# Patient Record
Sex: Female | Born: 1985 | Race: White | Hispanic: No | Marital: Married | Smoking: Never smoker
Health system: Southern US, Community
[De-identification: ages and names within clinical notes are randomized; demographics above are authoritative.]

## PROBLEM LIST (undated history)

## (undated) HISTORY — PX: KNEE SURGERY: SHX244

---
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
## 2009-08-25 ENCOUNTER — Encounter: Admission: RE | Admit: 2009-08-25 | Discharge: 2009-08-25 | Payer: Self-pay | Admitting: Family Medicine

## 2011-04-09 ENCOUNTER — Other Ambulatory Visit: Payer: Self-pay | Admitting: Lactation Services

## 2011-04-09 ENCOUNTER — Ambulatory Visit
Admission: RE | Admit: 2011-04-09 | Discharge: 2011-04-09 | Disposition: A | Discharge status: Home / Self Care | Payer: 59 | Source: Ambulatory Visit | Attending: Family Medicine | Admitting: Family Medicine

## 2011-04-09 ENCOUNTER — Other Ambulatory Visit: Payer: Self-pay | Admitting: Family Medicine

## 2011-04-09 DIAGNOSIS — S60229A Contusion of unspecified hand, initial encounter: Secondary | ICD-10-CM

## 2017-11-23 ENCOUNTER — Other Ambulatory Visit: Payer: Self-pay

## 2017-11-23 ENCOUNTER — Emergency Department
Admission: EM | Admit: 2017-11-23 | Discharge: 2017-11-24 | Disposition: A | Discharge status: Home / Self Care | Payer: 59 | Attending: Emergency Medicine | Admitting: Emergency Medicine

## 2017-11-23 ENCOUNTER — Encounter: Payer: Self-pay | Admitting: Emergency Medicine

## 2017-11-23 ENCOUNTER — Emergency Department: Payer: 59

## 2017-11-23 DIAGNOSIS — R109 Unspecified abdominal pain: Secondary | ICD-10-CM | POA: Diagnosis present

## 2017-11-23 DIAGNOSIS — R1012 Left upper quadrant pain: Secondary | ICD-10-CM | POA: Insufficient documentation

## 2017-11-23 LAB — URINALYSIS, COMPLETE (UACMP) WITH MICROSCOPIC
BILIRUBIN URINE: NEGATIVE
Bacteria, UA: NONE SEEN
GLUCOSE, UA: NEGATIVE mg/dL
HGB URINE DIPSTICK: NEGATIVE
Ketones, ur: NEGATIVE mg/dL
Leukocytes, UA: NEGATIVE
NITRITE: NEGATIVE
PH: 6 (ref 5.0–8.0)
Protein, ur: NEGATIVE mg/dL
SPECIFIC GRAVITY, URINE: 1.013 (ref 1.005–1.030)

## 2017-11-23 LAB — COMPREHENSIVE METABOLIC PANEL
ALBUMIN: 3.9 g/dL (ref 3.5–5.0)
ALK PHOS: 45 U/L (ref 38–126)
ALT: 13 U/L — AB (ref 14–54)
AST: 18 U/L (ref 15–41)
Anion gap: 7 (ref 5–15)
BILIRUBIN TOTAL: 0.5 mg/dL (ref 0.3–1.2)
BUN: 11 mg/dL (ref 6–20)
CALCIUM: 8.6 mg/dL — AB (ref 8.9–10.3)
CO2: 25 mmol/L (ref 22–32)
CREATININE: 1.08 mg/dL — AB (ref 0.44–1.00)
Chloride: 106 mmol/L (ref 101–111)
GFR calc Af Amer: >60 mL/min (ref >60)
GFR calc non Af Amer: >60 mL/min (ref >60)
GLUCOSE: 100 mg/dL — AB (ref 65–99)
Potassium: 3.8 mmol/L (ref 3.5–5.1)
SODIUM: 138 mmol/L (ref 135–145)
TOTAL PROTEIN: 7 g/dL (ref 6.5–8.1)

## 2017-11-23 LAB — CBC
HCT: 43.7 % (ref 35.0–47.0)
Hemoglobin: 14.8 g/dL (ref 12.0–16.0)
MCH: 29.6 pg (ref 26.0–34.0)
MCHC: 33.8 g/dL (ref 32.0–36.0)
MCV: 87.8 fL (ref 80.0–100.0)
PLATELETS: 252 10*3/uL (ref 150–440)
RBC: 4.98 MIL/uL (ref 3.80–5.20)
RDW: 13.1 % (ref 11.5–14.5)
WBC: 11 10*3/uL (ref 3.6–11.0)

## 2017-11-23 LAB — LIPASE, BLOOD: Lipase: 31 U/L (ref 11–51)

## 2017-11-23 MED ORDER — KETOROLAC TROMETHAMINE 30 MG/ML IJ SOLN
15.0000 mg | Freq: Once | INTRAMUSCULAR | Status: AC
  Administered 2017-11-23: 15 mg via INTRAVENOUS
  Filled 2017-11-23: qty 1

## 2017-11-23 MED ORDER — SODIUM CHLORIDE 0.9 % IV BOLUS
1000.0000 mL | Freq: Once | INTRAVENOUS | Status: AC
  Administered 2017-11-23: 1000 mL via INTRAVENOUS

## 2017-11-23 MED ORDER — FAMOTIDINE 20 MG PO TABS: 20.0000 mg | ORAL_TABLET | Freq: Two times a day (BID) | ORAL | 1 refills | Status: AC

## 2017-11-23 MED ORDER — IOPAMIDOL (ISOVUE-300) INJECTION 61%
100.0000 mL | Freq: Once | INTRAVENOUS | Status: AC | PRN
  Administered 2017-11-23: 100 mL via INTRAVENOUS

## 2017-11-23 MED ORDER — FAMOTIDINE 20 MG PO TABS
20.0000 mg | ORAL_TABLET | Freq: Once | ORAL | Status: AC
  Administered 2017-11-24: 20 mg via ORAL
  Filled 2017-11-23: qty 1

## 2017-11-23 NOTE — ED Triage Notes (Addendum)
Upper L abd pain x 4 days. [facility].

## 2017-11-23 NOTE — ED Provider Notes (Signed)
[facility] Emergency Department Provider Note  ____________________________________________  Time seen: Approximately 10:44 PM  I have reviewed the triage vital signs and the nursing notes.   HISTORY  Chief Complaint Abdominal Pain   HPI Robin Watson is a 32 y.o. female with no significant past medical history who presents for evaluation of abdominal pain.  Patient reports that she started to feel sick 4 days ago.  She initially had couple episodes of watery diarrhea.  The following day the diarrhea stopped and she had 2 or 3 episodes of nonbloody nonbilious emesis.  For the last 4 days she has had intermittent left-sided abdominal pain.  She went to walk-in and had an x-ray and basic labs which were within normal limits a few days ago.  She was given Zofran.  She has been taking it at home.  She reports resolution of the vomiting and diarrhea.  She was told that if the abdominal pain got worse that she needed to be evaluated in the emergency department.  She reports that the abdominal pain got worse last night.  The pain is constant sharp usually mild with a severe intermittent component.  At this time patient is complaining of 3 out of 10 pain.  She denies dysuria, hematuria, fever, vaginal bleeding, vaginal discharge located on the L quadrants.  No prior abdominal surgeries. No NSAIDs  PMH Reviewed - None  Meds Zofran  Allergies Patient has no known allergies.  FH Non contributory  Social History Smoking: none Alcohol: none Drugs: none   Review of Systems  Constitutional: Negative for fever. Eyes: Negative for visual changes. ENT: Negative for sore throat. Neck: No neck pain  Cardiovascular: Negative for chest pain. Respiratory: Negative for shortness of breath. Gastrointestinal: + L sided abdominal pain, vomiting and diarrhea. Genitourinary: Negative for dysuria. Musculoskeletal: Negative for back pain. Skin: Negative for  rash. Neurological: Negative for headaches, weakness or numbness. Psych: No SI or HI  ____________________________________________   PHYSICAL EXAM:  VITAL SIGNS: ED Triage Vitals [11/23/17 1737]  Enc Vitals Group     BP 122/76     Pulse Rate 67     Resp 20     Temp 99 F (37.2 C)     Temp Source Oral     SpO2 99 %     Weight 210 lb (95.3 kg)     Height 5\' 8"  (1.727 m)     Head Circumference      Peak Flow      Pain Score 3     Pain Loc      Pain Edu?      Excl. in GC?     Constitutional: Alert and oriented. Well appearing and in no apparent distress. HEENT:      Head: Normocephalic and atraumatic.         Eyes: Conjunctivae are normal. Sclera is non-icteric.       Mouth/Throat: Mucous membranes are moist.       Neck: Supple with no signs of meningismus. Cardiovascular: Regular rate and rhythm. No murmurs, gallops, or rubs. 2+ symmetrical distal pulses are present in all extremities. No JVD. Respiratory: Normal respiratory effort. Lungs are clear to auscultation bilaterally. No wheezes, crackles, or rhonchi.  Gastrointestinal: Soft, tenderness to palpation on the left upper quadrant, and non distended with positive bowel sounds. No rebound or guarding. Genitourinary: No CVA tenderness. Musculoskeletal: Nontender with normal range of motion in all extremities. No edema, cyanosis, or erythema of extremities. Neurologic: Normal speech  and language. Face is symmetric. Moving all extremities. No gross focal neurologic deficits are appreciated. Skin: Skin is warm, dry and intact. No rash noted. Psychiatric: Mood and affect are normal. Speech and behavior are normal.  ____________________________________________   LABS (all labs ordered are listed, but only abnormal results are displayed)  Labs Reviewed  COMPREHENSIVE METABOLIC PANEL - Abnormal; Notable for the following components:      Result Value   Glucose, Bld 100 (*)    Creatinine, Ser 1.08 (*)    Calcium 8.6 (*)     ALT 13 (*)    All other components within normal limits  URINALYSIS, COMPLETE (UACMP) WITH MICROSCOPIC - Abnormal; Notable for the following components:   Color, Urine YELLOW (*)    APPearance CLEAR (*)    Squamous Epithelial / LPF 0-5 (*)    All other components within normal limits  LIPASE, BLOOD  CBC   ____________________________________________  EKG  none  ____________________________________________  RADIOLOGY  I have personally reviewed the images performed during this visit and I agree with the Radiologist's read.   Interpretation by Radiologist:  Ct Abdomen Pelvis W Contrast  Result Date: 11/23/2017 CLINICAL DATA:  Upper left abdominal pain. EXAM: CT ABDOMEN AND PELVIS WITH CONTRAST TECHNIQUE: Multidetector CT imaging of the abdomen and pelvis was performed using the standard protocol following bolus administration of intravenous contrast. CONTRAST:  ISOVUE-300 IOPAMIDOL (ISOVUE-300) INJECTION 61% COMPARISON:  None. FINDINGS: Lower chest: No acute abnormality. Hepatobiliary: No focal liver abnormality is seen. No gallstones, gallbladder wall thickening, or biliary dilatation. Pancreas: Unremarkable. No pancreatic ductal dilatation or surrounding inflammatory changes. Spleen: Normal in size without focal abnormality. Adrenals/Urinary Tract: Adrenal glands are unremarkable. Kidneys are normal, without renal calculi, focal lesion, or hydronephrosis. Bladder is unremarkable. Stomach/Bowel: Stomach is within normal limits. Appendix appears normal. No evidence of bowel wall thickening, distention, or inflammatory changes. Vascular/Lymphatic: No significant vascular findings are present. No enlarged abdominal or pelvic lymph nodes. Reproductive: Uterus and bilateral adnexa are unremarkable. Other: No abdominal wall hernia or abnormality. No abdominopelvic ascites. Musculoskeletal: No acute or significant osseous findings. IMPRESSION: No evidence of acute abnormalities within the  abdomen or pelvis. Electronically Signed   By: Ted Mcalpine M.D.   On: 11/23/2017 23:24      ____________________________________________   PROCEDURES  Procedure(s) performed: None Procedures Critical Care performed:  None ____________________________________________   INITIAL IMPRESSION / ASSESSMENT AND PLAN / ED COURSE   32 y.o. female with no significant past medical history who presents for evaluation of abdominal pain x 4.  Initially patient had nausea, vomiting, and diarrhea however that has resolved.  She is well-appearing, no distress, has normal vital signs, abdomen is soft with mild tenderness on the left upper quadrant, no rebound or guarding.  Differential diagnosis including colitis versus diverticulitis versus enteritis versus SBO versus peptic ulcer disease versus gastritis. No tenderness on the LLQ therefore less likely ovarian pathology.  Labs including CBC, CMP, lipase, urine pregnancy, and urinalysis are all within normal limits.  Since patient has had persistent symptoms for 4 days with negative x-rays I recommended a CT abdomen pelvis which patient agreed.  CT is pending at this time.  Will treat with Toradol and fluids.    _________________________ 11:36 PM on 11/23/2017 -----------------------------------------  CT negative for acute pathology. Patient remains well-appearing with soft abdomen, she has very mild tenderness on the left upper quadrant.  I believe at this time presentation is concerning for possible gastritis or ulcer.  Will start  patient on Pepcid and discharge her home on Zofran.  Discussed return precautions with patient. Patient currently receiving IVF, will dc after fluids are done.   As part of my medical decision making, I reviewed the following data within the electronic MEDICAL RECORD NUMBER Nursing notes reviewed and incorporated, Labs reviewed , Radiograph reviewed , Notes from prior ED visits and [place] Controlled Substance  Database    Pertinent labs & imaging results that were available during my care of the patient were reviewed by me and considered in my medical decision making (see chart for details).    ____________________________________________   FINAL CLINICAL IMPRESSION(S) / ED DIAGNOSES  Final diagnoses:  LUQ abdominal pain      NEW MEDICATIONS STARTED DURING THIS VISIT:  ED Discharge Orders        Ordered    famotidine (PEPCID) 20 MG tablet  2 times daily     11/23/17 2337       Note:  This document was prepared using Dragon voice recognition software and may include unintentional dictation errors.    Nita SickleVeronese, [place], MD 11/23/17 [phone]

## 2017-11-23 NOTE — ED Notes (Signed)
Patient transported to CT 

## 2017-11-23 NOTE — Discharge Instructions (Addendum)

## 2017-11-25 ENCOUNTER — Ambulatory Visit
Admission: RE | Admit: 2017-11-25 | Discharge: 2017-11-25 | Disposition: A | Discharge status: Home / Self Care | Payer: 59 | Source: Ambulatory Visit | Attending: Physician Assistant | Admitting: Physician Assistant

## 2017-11-25 ENCOUNTER — Other Ambulatory Visit: Payer: Self-pay | Admitting: Physician Assistant

## 2017-11-25 DIAGNOSIS — R109 Unspecified abdominal pain: Secondary | ICD-10-CM

## 2019-06-11 LAB — OB RESULTS CONSOLE HEPATITIS B SURFACE ANTIGEN: Hepatitis B Surface Ag: NEGATIVE

## 2019-06-11 LAB — OB RESULTS CONSOLE RUBELLA ANTIBODY, IGM: Rubella: IMMUNE

## 2019-06-30 LAB — OB RESULTS CONSOLE GC/CHLAMYDIA
Chlamydia: NEGATIVE
Gonorrhea: NEGATIVE

## 2019-07-18 IMAGING — CT CT ABD-PELV W/ CM
2 of 4 series · 16 of 46 positions shown, 18 images · IV contrast (APPLIED)
Comparison: None.

CLINICAL DATA: Upper left abdominal pain.

EXAM:
CT ABDOMEN AND PELVIS WITH CONTRAST
TECHNIQUE: Multidetector CT imaging of the abdomen and pelvis was performed
using the standard protocol following bolus administration of
intravenous contrast.
CONTRAST:  100mL U4MVW1-TOO IOPAMIDOL (U4MVW1-TOO) INJECTION 61%

[Series 2: routine abd/pel with · axial · 0.80mm/px · z∈[-486,-46]mm · 13 of 97 slices shown, 15 images]
[im 5/97  soft-tissue]
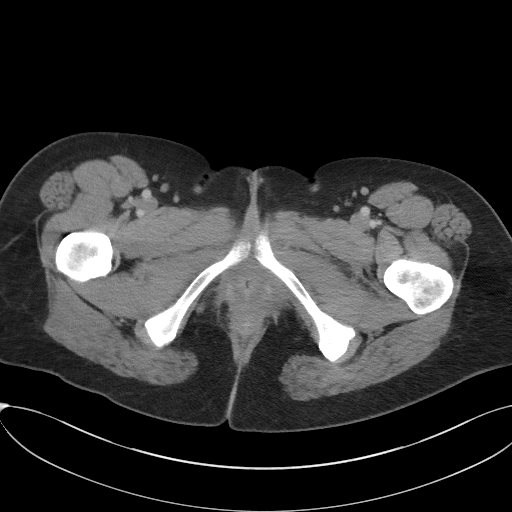
[im 5/97  bone]
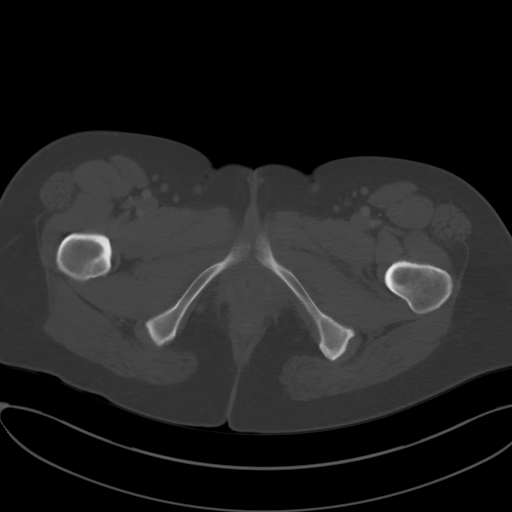
[im 13/97  soft-tissue]
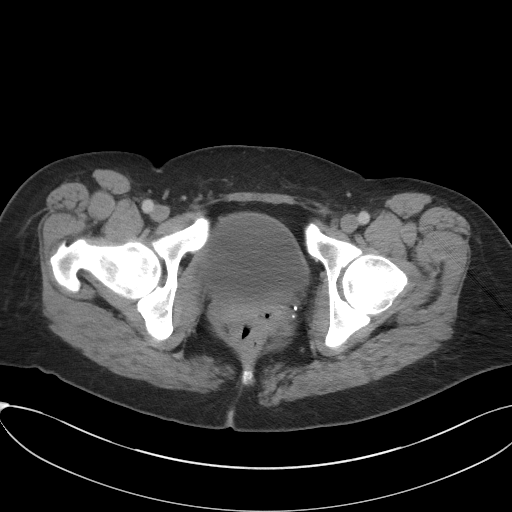
[im 21/97  soft-tissue]
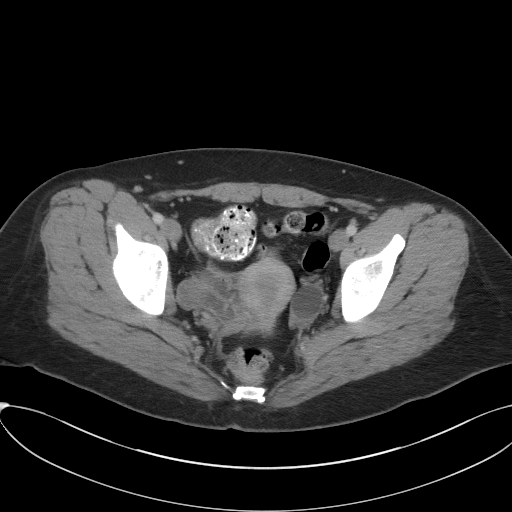
[im 29/97  soft-tissue]
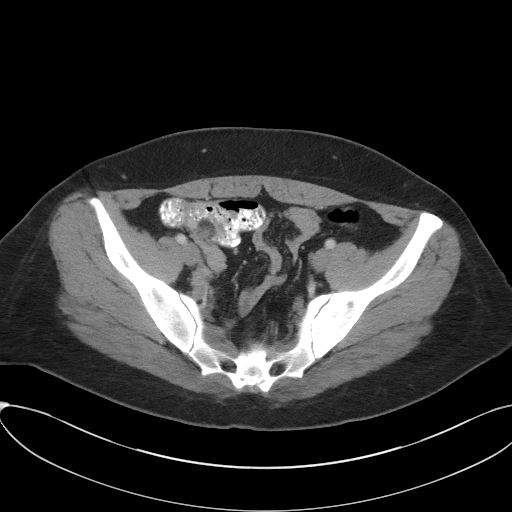
[im 33/97  soft-tissue]
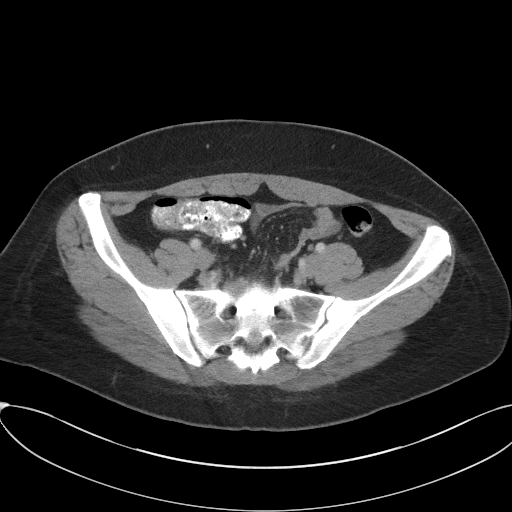
[im 41/97  soft-tissue]
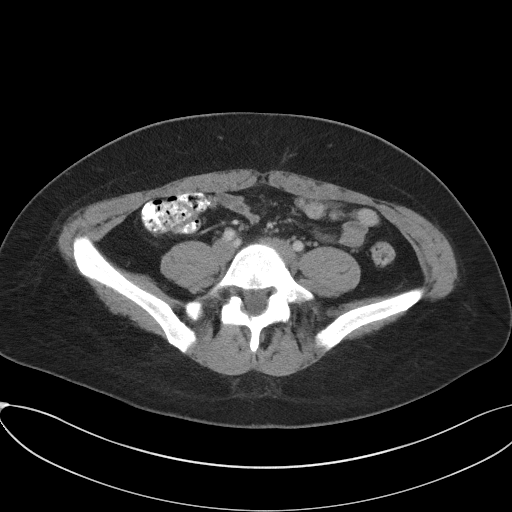
[im 49/97  soft-tissue]
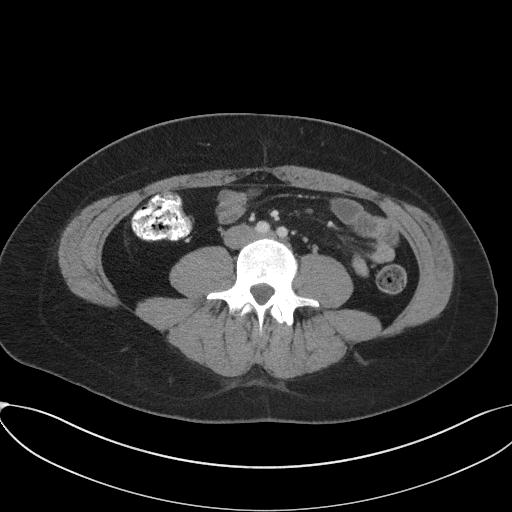
[im 57/97  soft-tissue]
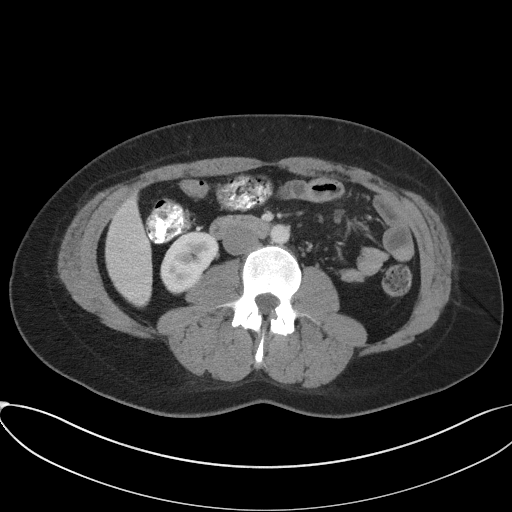
[im 65/97  soft-tissue]
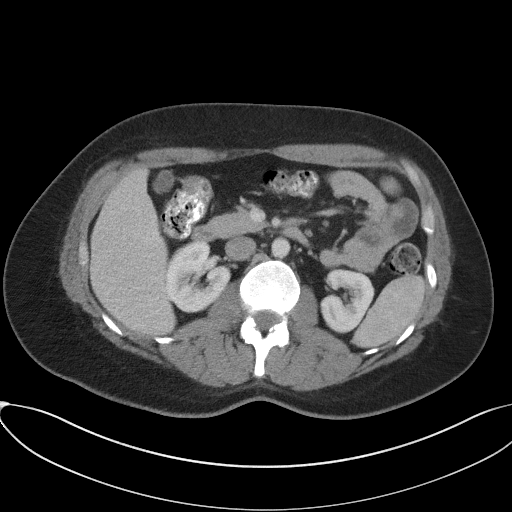
[im 65/97  bone]
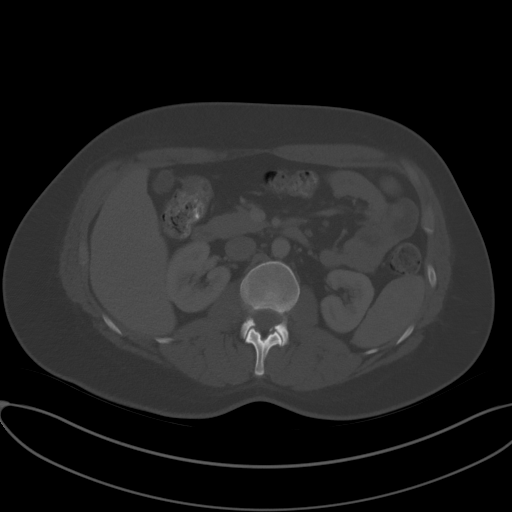
[im 69/97  soft-tissue]
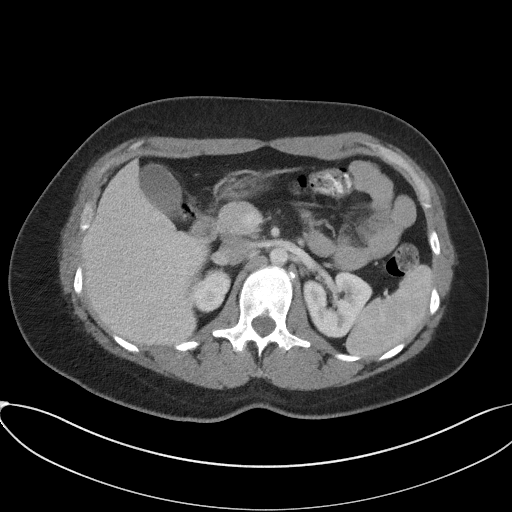
[im 77/97  soft-tissue]
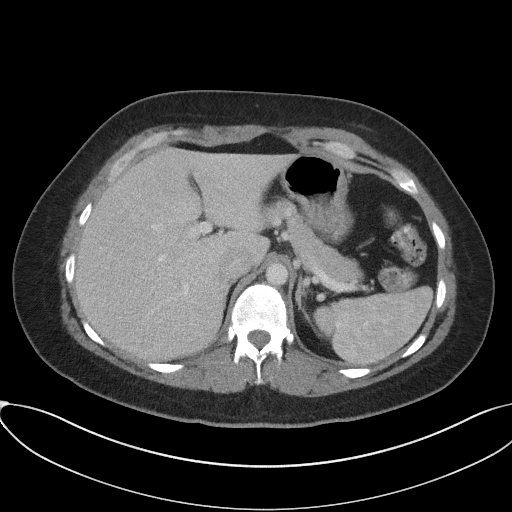
[im 85/97  soft-tissue]
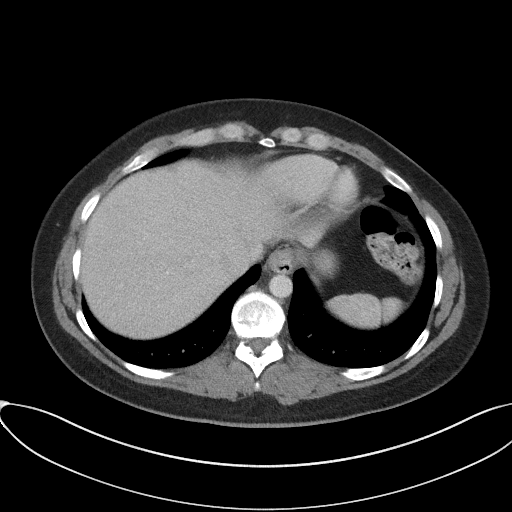
[im 93/97  soft-tissue]
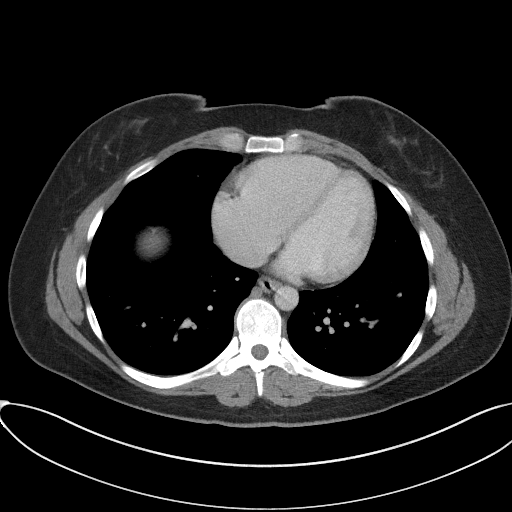

[Series 5: coronal st · coronal · 0.77mm/px · 3 of 94 slices shown]
[im 32/94  soft-tissue]
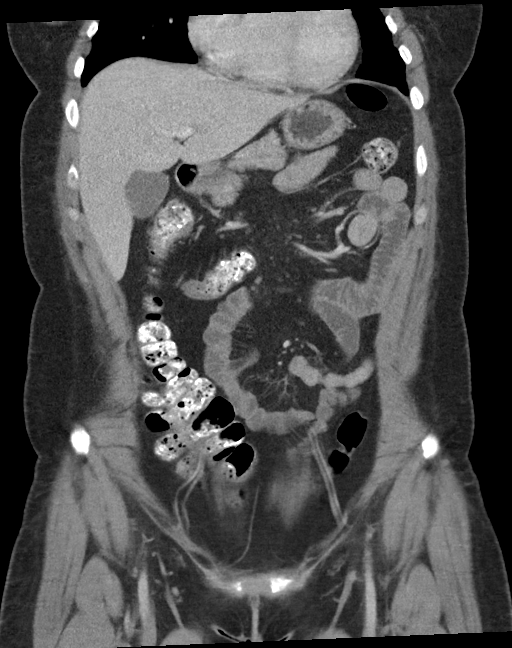
[im 42/94  soft-tissue]
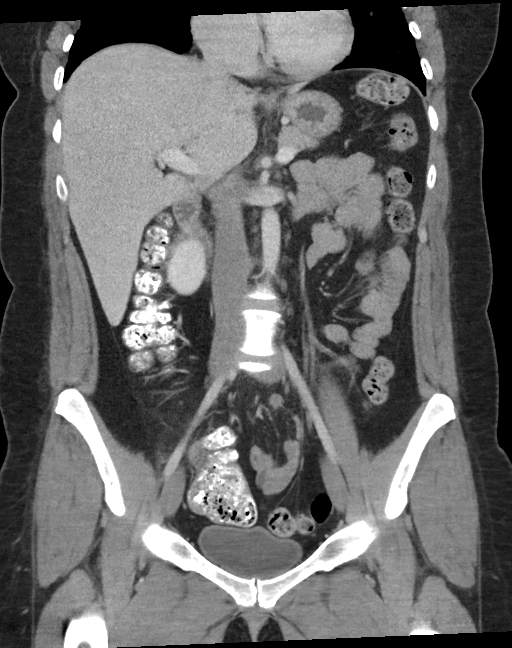
[im 52/94  soft-tissue]
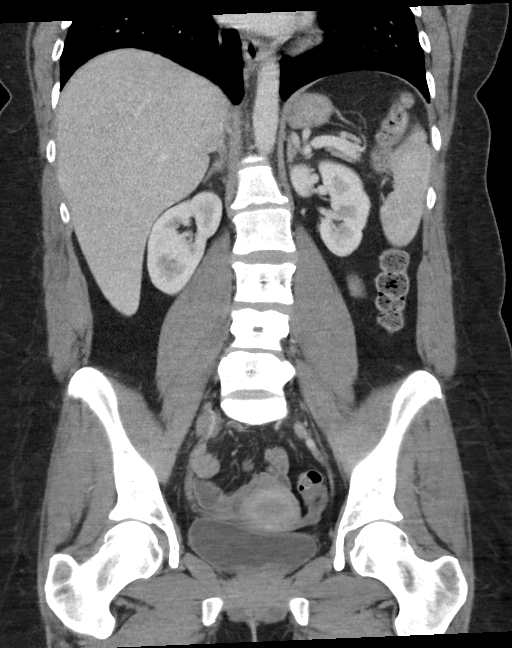

[16 of 46 positions shown; findings below may reference images not displayed]

FINDINGS: Lower chest: No acute abnormality.

Hepatobiliary: No focal liver abnormality is seen. No gallstones,
gallbladder wall thickening, or biliary dilatation.

Pancreas: Unremarkable. No pancreatic ductal dilatation or
surrounding inflammatory changes.

Spleen: Normal in size without focal abnormality.

Adrenals/Urinary Tract: Adrenal glands are unremarkable. Kidneys are
normal, without renal calculi, focal lesion, or hydronephrosis.
Bladder is unremarkable.

Stomach/Bowel: Stomach is within normal limits. Appendix appears
normal. No evidence of bowel wall thickening, distention, or
inflammatory changes.

Vascular/Lymphatic: No significant vascular findings are present. No
enlarged abdominal or pelvic lymph nodes.

Reproductive: Uterus and bilateral adnexa are unremarkable.

Other: No abdominal wall hernia or abnormality. No abdominopelvic
ascites.

Musculoskeletal: No acute or significant osseous findings.
IMPRESSION: No evidence of acute abnormalities within the abdomen or pelvis.

## 2019-10-14 LAB — OB RESULTS CONSOLE HIV ANTIBODY (ROUTINE TESTING): HIV: NONREACTIVE

## 2020-01-09 ENCOUNTER — Other Ambulatory Visit: Payer: Self-pay

## 2020-01-09 ENCOUNTER — Encounter (HOSPITAL_COMMUNITY): Payer: Self-pay | Admitting: Obstetrics and Gynecology

## 2020-01-09 ENCOUNTER — Inpatient Hospital Stay (HOSPITAL_COMMUNITY)
Admission: AD | Admit: 2020-01-09 | Discharge: 2020-01-11 | DRG: 807 | Disposition: A | Discharge status: Home / Self Care | Payer: 59 | Attending: Obstetrics and Gynecology | Admitting: Obstetrics and Gynecology

## 2020-01-09 DIAGNOSIS — Z3A40 40 weeks gestation of pregnancy: Secondary | ICD-10-CM

## 2020-01-09 DIAGNOSIS — Z8616 Personal history of COVID-19: Secondary | ICD-10-CM

## 2020-01-09 NOTE — MAU Note (Signed)
Pt reports contractions that started at 1:15pm today, and are now every 2.5-3 mins apart for the last couple of hours. Denies LOF. Reports some bloody show. Was 2cm in office yesterday. Reports good fetal movement.

## 2020-01-10 ENCOUNTER — Inpatient Hospital Stay (HOSPITAL_COMMUNITY): Payer: 59 | Admitting: Anesthesiology

## 2020-01-10 ENCOUNTER — Encounter (HOSPITAL_COMMUNITY): Payer: Self-pay | Admitting: Obstetrics and Gynecology

## 2020-01-10 ENCOUNTER — Other Ambulatory Visit: Payer: Self-pay

## 2020-01-10 DIAGNOSIS — O26893 Other specified pregnancy related conditions, third trimester: Secondary | ICD-10-CM | POA: Diagnosis present

## 2020-01-10 DIAGNOSIS — Z8616 Personal history of COVID-19: Secondary | ICD-10-CM | POA: Diagnosis not present

## 2020-01-10 DIAGNOSIS — Z3A40 40 weeks gestation of pregnancy: Secondary | ICD-10-CM | POA: Diagnosis not present

## 2020-01-10 LAB — SARS CORONAVIRUS 2 BY RT PCR (HOSPITAL ORDER, PERFORMED IN ~~LOC~~ HOSPITAL LAB): SARS Coronavirus 2: POSITIVE — AB

## 2020-01-10 LAB — CBC
HCT: 37.6 % (ref 36.0–46.0)
Hemoglobin: 12.8 g/dL (ref 12.0–15.0)
MCH: 28.8 pg (ref 26.0–34.0)
MCHC: 34 g/dL (ref 30.0–36.0)
MCV: 84.7 fL (ref 80.0–100.0)
Platelets: 170 10*3/uL (ref 150–400)
RBC: 4.44 MIL/uL (ref 3.87–5.11)
RDW: 15.9 % — ABNORMAL HIGH (ref 11.5–15.5)
WBC: 26.7 10*3/uL — ABNORMAL HIGH (ref 4.0–10.5)
nRBC: 0 % (ref 0.0–0.2)

## 2020-01-10 LAB — TYPE AND SCREEN
ABO/RH(D): A POS
Antibody Screen: NEGATIVE

## 2020-01-10 LAB — ABO/RH: ABO/RH(D): A POS

## 2020-01-10 LAB — RPR: RPR Ser Ql: NONREACTIVE

## 2020-01-10 MED ORDER — OXYCODONE-ACETAMINOPHEN 5-325 MG PO TABS: 1.0000 | ORAL_TABLET | ORAL | Status: DC | PRN

## 2020-01-10 MED ORDER — EPHEDRINE 5 MG/ML INJ: 10.0000 mg | INTRAVENOUS | Status: DC | PRN

## 2020-01-10 MED ORDER — LACTATED RINGERS IV SOLN: INTRAVENOUS | Status: DC

## 2020-01-10 MED ORDER — PHENYLEPHRINE 40 MCG/ML (10ML) SYRINGE FOR IV PUSH (FOR BLOOD PRESSURE SUPPORT): 80.0000 ug | PREFILLED_SYRINGE | INTRAVENOUS | Status: DC | PRN

## 2020-01-10 MED ORDER — ONDANSETRON HCL 4 MG/2ML IJ SOLN: 4.0000 mg | Freq: Four times a day (QID) | INTRAMUSCULAR | Status: DC | PRN

## 2020-01-10 MED ORDER — WITCH HAZEL-GLYCERIN EX PADS: 1.0000 "application " | MEDICATED_PAD | CUTANEOUS | Status: DC | PRN

## 2020-01-10 MED ORDER — FENTANYL CITRATE (PF) 100 MCG/2ML IJ SOLN
INTRAMUSCULAR | Status: AC
  Filled 2020-01-10: qty 2

## 2020-01-10 MED ORDER — FLEET ENEMA 7-19 GM/118ML RE ENEM: 1.0000 | ENEMA | RECTAL | Status: DC | PRN

## 2020-01-10 MED ORDER — ACETAMINOPHEN 325 MG PO TABS: 650.0000 mg | ORAL_TABLET | ORAL | Status: DC | PRN

## 2020-01-10 MED ORDER — FENTANYL-BUPIVACAINE-NACL 0.5-0.125-0.9 MG/250ML-% EP SOLN: 12.0000 mL/h | EPIDURAL | Status: DC | PRN

## 2020-01-10 MED ORDER — SIMETHICONE 80 MG PO CHEW: 80.0000 mg | CHEWABLE_TABLET | ORAL | Status: DC | PRN

## 2020-01-10 MED ORDER — ONDANSETRON HCL 4 MG PO TABS: 4.0000 mg | ORAL_TABLET | ORAL | Status: DC | PRN

## 2020-01-10 MED ORDER — DIBUCAINE (PERIANAL) 1 % EX OINT: 1.0000 "application " | TOPICAL_OINTMENT | CUTANEOUS | Status: DC | PRN

## 2020-01-10 MED ORDER — IBUPROFEN 600 MG PO TABS
600.0000 mg | ORAL_TABLET | Freq: Four times a day (QID) | ORAL | Status: DC
  Administered 2020-01-10 – 2020-01-11 (×5): 600 mg via ORAL
  Filled 2020-01-10 (×5): qty 1

## 2020-01-10 MED ORDER — BENZOCAINE-MENTHOL 20-0.5 % EX AERO
1.0000 "application " | INHALATION_SPRAY | CUTANEOUS | Status: DC | PRN
  Administered 2020-01-10: 1 via TOPICAL
  Filled 2020-01-10: qty 56

## 2020-01-10 MED ORDER — OXYTOCIN BOLUS FROM INFUSION
500.0000 mL | Freq: Once | INTRAVENOUS | Status: AC
  Administered 2020-01-10: 500 mL via INTRAVENOUS

## 2020-01-10 MED ORDER — OXYCODONE HCL 5 MG PO TABS: 5.0000 mg | ORAL_TABLET | ORAL | Status: DC | PRN

## 2020-01-10 MED ORDER — DIPHENHYDRAMINE HCL 50 MG/ML IJ SOLN: 12.5000 mg | INTRAMUSCULAR | Status: DC | PRN

## 2020-01-10 MED ORDER — SENNOSIDES-DOCUSATE SODIUM 8.6-50 MG PO TABS
2.0000 | ORAL_TABLET | ORAL | Status: DC
  Administered 2020-01-11: 2 via ORAL
  Filled 2020-01-10: qty 2

## 2020-01-10 MED ORDER — FENTANYL CITRATE (PF) 100 MCG/2ML IJ SOLN
100.0000 ug | INTRAMUSCULAR | Status: DC | PRN
  Administered 2020-01-10: 100 ug via INTRAVENOUS

## 2020-01-10 MED ORDER — DIPHENHYDRAMINE HCL 25 MG PO CAPS: 25.0000 mg | ORAL_CAPSULE | Freq: Four times a day (QID) | ORAL | Status: DC | PRN

## 2020-01-10 MED ORDER — COCONUT OIL OIL: 1.0000 "application " | TOPICAL_OIL | Status: DC | PRN

## 2020-01-10 MED ORDER — PRENATAL MULTIVITAMIN CH
1.0000 | ORAL_TABLET | Freq: Every day | ORAL | Status: DC
  Administered 2020-01-10 – 2020-01-11 (×2): 1 via ORAL
  Filled 2020-01-10 (×2): qty 1

## 2020-01-10 MED ORDER — OXYTOCIN 40 UNITS IN NORMAL SALINE INFUSION - SIMPLE MED
2.5000 [IU]/h | INTRAVENOUS | Status: DC
  Filled 2020-01-10: qty 1000

## 2020-01-10 MED ORDER — LIDOCAINE HCL (PF) 1 % IJ SOLN: 30.0000 mL | INTRAMUSCULAR | Status: DC | PRN

## 2020-01-10 MED ORDER — OXYCODONE HCL 5 MG PO TABS: 10.0000 mg | ORAL_TABLET | ORAL | Status: DC | PRN

## 2020-01-10 MED ORDER — SODIUM CHLORIDE (PF) 0.9 % IJ SOLN
INTRAMUSCULAR | Status: DC | PRN
  Administered 2020-01-10: 12 mL/h via EPIDURAL

## 2020-01-10 MED ORDER — SOD CITRATE-CITRIC ACID 500-334 MG/5ML PO SOLN: 30.0000 mL | ORAL | Status: DC | PRN

## 2020-01-10 MED ORDER — TETANUS-DIPHTH-ACELL PERTUSSIS 5-2.5-18.5 LF-MCG/0.5 IM SUSP: 0.5000 mL | Freq: Once | INTRAMUSCULAR | Status: DC

## 2020-01-10 MED ORDER — OXYCODONE-ACETAMINOPHEN 5-325 MG PO TABS: 2.0000 | ORAL_TABLET | ORAL | Status: DC | PRN

## 2020-01-10 MED ORDER — LIDOCAINE HCL (PF) 1 % IJ SOLN
INTRAMUSCULAR | Status: DC | PRN
  Administered 2020-01-10: 10 mL via EPIDURAL

## 2020-01-10 MED ORDER — LACTATED RINGERS IV SOLN: 500.0000 mL | INTRAVENOUS | Status: DC | PRN

## 2020-01-10 MED ORDER — LACTATED RINGERS IV SOLN: 500.0000 mL | Freq: Once | INTRAVENOUS | Status: DC

## 2020-01-10 MED ORDER — ZOLPIDEM TARTRATE 5 MG PO TABS: 5.0000 mg | ORAL_TABLET | Freq: Every evening | ORAL | Status: DC | PRN

## 2020-01-10 MED ORDER — FENTANYL-BUPIVACAINE-NACL 0.5-0.125-0.9 MG/250ML-% EP SOLN
EPIDURAL | Status: AC
  Filled 2020-01-10: qty 250

## 2020-01-10 MED ORDER — ONDANSETRON HCL 4 MG/2ML IJ SOLN: 4.0000 mg | INTRAMUSCULAR | Status: DC | PRN

## 2020-01-10 NOTE — Progress Notes (Signed)
Delivery Note At 6:56 AM a viable female was delivered via Vaginal, Spontaneous (Presentation: Left Occiput Anterior).  APGAR: , ; weight  .   Placenta status: Spontaneous, Intact.  Cord: 3 vessels with the following complications: None.  Cord pH:   Anesthesia: Epidural Episiotomy: None Lacerations: second degree ML lac with left sulcus extension repaired, rectum checked>intact Suture Repair: 2.0 3.0 chromic vicryl vicryl rapide Est. Blood Loss (mL):  250  Mom to postpartum.  Baby to Couplet care / Skin to Skin.  Robin Watson II 01/10/2020, 7:22 AM

## 2020-01-10 NOTE — Anesthesia Preprocedure Evaluation (Signed)

## 2020-01-10 NOTE — Lactation Note (Signed)
This note was copied from a baby's chart. Lactation Consultation Note  Patient Name: Robin Watson KZLDJ'T Date: 01/10/2020 Reason for consult: Initial assessment;Term;Primapara;1st time breastfeeding  Visited with mom of a 7 hours old FT female, she's a P1. Mom reported (+) breast changes during the pregnancy, she's also familiar with hand expression, her RN showed her but was willing to revise again with LC. Mom was able to get two drops of colostrum with LC assistance, praised her for her efforts. She has a Spectra S2 DEBP at home.  Noticed that mom's nipples were semi-flat/very short shafted when assisting with hand expression. Set her up with a hand pump and breast shells, instructions, cleaning and storage were reviewed as well as milk storage guidelines.  Offered assistance with latch and mom agreed to wake baby up to feed, she was doing STS. LC took baby STS to mother's left breast in cross cradle position and she was able to latch after a few attempts, but required some repositioning. A few swallows noted, baby went on a off for 10 minutes. Reviewed normal newborn behavior, feeding cues, size of baby's stomach and cluster feeding.  Feeding plan:  1. Encouraged mom to feed baby STS 8-12 times/24 hours or sooner if feeding cues are present 2. Hand expression and spoon feeding were also encouraged  3. She'll pre-pump for 5 minutes prior feedings 4. She'll start wearing her breast shells tomorrow, daytime only  GOB is mom's support person and was present in the room at the time of [facility] consultation. BF brochure, BF resources and feeding diary were reviewed. Family reported all questions and concerns were answered, they're both aware of LC OP services and will call PRN.    Maternal Data Formula Feeding for Exclusion: No Has patient been taught Hand Expression?: Yes Does the patient have breastfeeding experience prior to this delivery?: No  Feeding Feeding Type: Breast  Fed  LATCH Score Latch: Repeated attempts needed to sustain latch, nipple held in mouth throughout feeding, stimulation needed to elicit sucking reflex.  Audible Swallowing: A few with stimulation  Type of Nipple: Everted at rest and after stimulation(semi-flat, very short shafted)  Comfort (Breast/Nipple): Soft / non-tender  Hold (Positioning): Assistance needed to correctly position infant at breast and maintain latch.  LATCH Score: 7  Interventions Interventions: Breast feeding basics reviewed;Assisted with latch;Skin to skin;Breast massage;Hand express;Breast compression;Reverse pressure;Hand pump;Adjust position;Support pillows;Shells  Lactation Tools Discussed/Used Tools: Pump;Shells Shell Type: Inverted Breast pump type: Manual WIC Program: No Pump Review: Setup, frequency, and cleaning;Milk Storage Initiated by:: MPeck Date initiated:: 01/10/20   Consult Status Consult Status: Follow-up Date: 01/11/20 Follow-up type: In-patient    Savonna Birchmeier Venetia Constable 01/10/2020, 2:47 PM

## 2020-01-10 NOTE — Anesthesia Postprocedure Evaluation (Signed)
Anesthesia Post Note  Patient: Robin Watson  Procedure(s) Performed: AN AD HOC LABOR EPIDURAL     Patient location during evaluation: Mother Baby Anesthesia Type: Epidural Level of consciousness: awake and alert and oriented Pain management: satisfactory to patient Vital Signs Assessment: post-procedure vital signs reviewed and stable Respiratory status: respiratory function stable Cardiovascular status: stable Postop Assessment: no headache, no backache, epidural receding, patient able to bend at knees, no signs of nausea or vomiting, adequate PO intake, no apparent nausea or vomiting and able to ambulate Anesthetic complications: no    Last Vitals:  Vitals:   01/10/20 1015 01/10/20 1430  BP: 133/72 134/76  Pulse: 86 99  Resp: 18 18  Temp: 36.8 C 36.7 C  SpO2: 100% 99%    Last Pain:  Vitals:   01/10/20 1430  TempSrc:   PainSc: 0-No pain   Pain Goal: Patients Stated Pain Goal: 0 (01/09/20 2245)                 Jarrett Albor

## 2020-01-10 NOTE — H&P (Signed)
Robin Watson is a 34 y.o. female presenting for UCs. SROM on L&D>clear. Complicated by AMA with Panorama>46XX. OB History    Gravida  1   Para      Term      Preterm      AB      Living        SAB      TAB      Ectopic      Multiple      Live Births             History reviewed. No pertinent past medical history. Past Surgical History:  Procedure Laterality Date  . KNEE SURGERY     2001, 2002, 2005   Family History: family history is not on file. Social History:  reports that she has never smoked. She has never used smokeless tobacco. She reports previous alcohol use. She reports previous drug use.     Maternal Diabetes: No Genetic Screening: Normal Maternal Ultrasounds/Referrals: Normal Fetal Ultrasounds or other Referrals:  None Maternal Substance Abuse:  No Significant Maternal Medications:  None Significant Maternal Lab Results:  Group B Strep negative Other Comments:  None  Review of Systems  Eyes: Negative for visual disturbance.  Gastrointestinal: Negative for abdominal pain.  Neurological: Negative for headaches.   Maternal Medical History:  Reason for admission: Contractions.   Fetal activity: Perceived fetal activity is normal.      Dilation: 10 Effacement (%): 100 Station: Plus 1, Plus 2 Exam by:: Donalynn Furlong RN Blood pressure (!) 110/51, pulse 89, temperature 98.3 F (36.8 C), temperature source Oral, resp. rate 18, height 5\' 8"  (1.727 m), weight 112.5 kg, SpO2 97 %. Maternal Exam:  Abdomen: Fetal presentation: vertex     Fetal Exam Fetal Monitor Review: Pattern: accelerations present.       Physical Exam  Cardiovascular: Normal rate.  Respiratory: Effort normal.  GI: Soft.    C/C/+3/vtx  Prenatal labs: ABO, Rh: --/--/A POS, A POS Performed at [facility], [address] [place], [place]  [id number]) Antibody: NEG (05/16 0135) Rubella: Immune (10/15 0000) RPR:    HBsAg: Negative (10/15  0000)  HIV: Non-reactive (02/17 0000)  GBS:     Assessment/Plan: 34 yo G1P0 in active labor Anticipate vaginal delivery  31 II 01/10/2020, 7:18 AM

## 2020-01-10 NOTE — Progress Notes (Signed)
Patient tested positive at an outside facility for Covid 12/13/19 and had mild symptoms as documented by her telephone calls to office. Now asymptomatic.

## 2020-01-10 NOTE — Anesthesia Procedure Notes (Signed)
Epidural Patient location during procedure: OB Start time: 01/10/2020 2:21 AM End time: 01/10/2020 2:34 AM  Staffing Anesthesiologist: Lucretia Kern, MD Performed: anesthesiologist   Preanesthetic Checklist Completed: patient identified, IV checked, risks and benefits discussed, monitors and equipment checked, pre-op evaluation and timeout performed  Epidural Patient position: sitting Prep: DuraPrep Patient monitoring: heart rate, continuous pulse ox and blood pressure Approach: midline Location: L3-L4 Injection technique: LOR air  Needle:  Needle type: Tuohy  Needle gauge: 17 G Needle length: 9 cm Needle insertion depth: 9 cm Catheter type: closed end flexible Catheter size: 19 Gauge Catheter at skin depth: 14 cm Test dose: negative  Assessment Events: blood not aspirated, injection not painful, no injection resistance, no paresthesia and negative IV test  Additional Notes Reason for block:procedure for pain

## 2020-01-11 LAB — CBC
HCT: 31.8 % — ABNORMAL LOW (ref 36.0–46.0)
Hemoglobin: 10.4 g/dL — ABNORMAL LOW (ref 12.0–15.0)
MCH: 29.3 pg (ref 26.0–34.0)
MCHC: 32.7 g/dL (ref 30.0–36.0)
MCV: 89.6 fL (ref 80.0–100.0)
Platelets: 139 10*3/uL — ABNORMAL LOW (ref 150–400)
RBC: 3.55 MIL/uL — ABNORMAL LOW (ref 3.87–5.11)
RDW: 16.6 % — ABNORMAL HIGH (ref 11.5–15.5)
WBC: 20.6 10*3/uL — ABNORMAL HIGH (ref 4.0–10.5)
nRBC: 0 % (ref 0.0–0.2)

## 2020-01-11 NOTE — Lactation Note (Signed)
This note was copied from a baby's chart. Lactation Consultation Note  Patient Name: Robin Watson LTRVU'Y Date: 01/11/2020   Lactation visit attempted; Mom in shower. LC to return.    Lurline Hare [facility] 01/11/2020, 12:26 PM

## 2020-01-11 NOTE — Discharge Summary (Signed)
Postpartum Discharge Summary     Patient Name: Robin Watson DOB: Jan 18, 1986 MRN: [id number]  Date of admission: 01/09/2020 Delivery date:01/10/2020  Delivering provider: Everlene Farrier  Date of discharge: 01/11/2020  Admitting diagnosis: Normal labor and delivery [O80] Intrauterine pregnancy: [redacted]w[redacted]d    Secondary diagnosis:  Active Problems:   Normal labor and delivery  Additional problems: none    Discharge diagnosis: Term Pregnancy Delivered                                              Post partum procedures:none Augmentation: AROM Complications: None  Hospital course: Onset of Labor With Vaginal Delivery      34y.o. yo G1P1001 at 474w0das admitted in Active Labor on 01/09/2020. Patient had an uncomplicated labor course as follows:  Membrane Rupture Time/Date: 5:30 AM ,01/10/2020   Delivery Method:Vaginal, Spontaneous  Episiotomy: None  Lacerations:  2nd degree;Sulcus;Perineal  Patient had an uncomplicated postpartum course.  She is ambulating, tolerating a regular diet, passing flatus, and urinating well. Patient is discharged home in stable condition on 01/11/20.  Newborn Data: Birth date:01/10/2020  Birth time:6:56 AM  Gender:Female  Living status:Living  Apgars:8 ,9   Magnesium Sulfate received: No BMZ received: No Rhophylac:N/A MMR:N/A T-DaP:Given prenatally Flu: N/A Transfusion:No  Physical exam  Vitals:   01/10/20 1430 01/10/20 1838 01/10/20 2110 01/11/20 0506  BP: 134/76 130/67 132/71 115/78  Pulse: 99 (!) 106 88 79  Resp: 18 19 20 20   Temp: 98 F (36.7 C) 99.1 F (37.3 C) 98.5 F (36.9 C) (!) 97.2 F (36.2 C)  TempSrc:  Oral Oral Axillary  SpO2: 99% 99% 98% 100%  Weight:      Height:       General: alert Lochia: appropriate Uterine Fundus: firm Incision: Healing well with no significant drainage DVT Evaluation: No evidence of DVT seen on physical exam. Labs: Lab Results  Component Value Date   WBC 20.6 (H) 01/11/2020   HGB 10.4 (L)  01/11/2020   HCT 31.8 (L) 01/11/2020   MCV 89.6 01/11/2020   PLT 139 (L) 01/11/2020   CMP Latest Ref Rng & Units 11/23/2017  Glucose 65 - 99 mg/dL 100(H)  BUN 6 - 20 mg/dL 11  Creatinine 0.44 - 1.00 mg/dL 1.08(H)  Sodium 135 - 145 mmol/L 138  Potassium 3.5 - 5.1 mmol/L 3.8  Chloride 101 - 111 mmol/L 106  CO2 22 - 32 mmol/L 25  Calcium 8.9 - 10.3 mg/dL 8.6(L)  Total Protein 6.5 - 8.1 g/dL 7.0  Total Bilirubin 0.3 - 1.2 mg/dL 0.5  Alkaline Phos 38 - 126 U/L 45  AST 15 - 41 U/L 18  ALT 14 - 54 U/L 13(L)   Edinburgh Score: Edinburgh Postnatal Depression Scale Screening Tool 01/10/2020  I have been able to laugh and see the funny side of things. (No Data)      After visit meds:  Allergies as of 01/11/2020   No Known Allergies     Medication List    STOP taking these medications   HYDROcodone-acetaminophen 5-325 MG tablet Commonly known as: NORCO/VICODIN   promethazine 25 MG tablet Commonly known as: PHENERGAN     TAKE these medications   famotidine 20 MG tablet Commonly known as: PEPCID Take 1 tablet (20 mg total) by mouth 2 (two) times daily. What changed: Another medication with the same name  was removed. Continue taking this medication, and follow the directions you see here.   multivitamin-prenatal 27-0.8 MG Tabs tablet Take 1 tablet by mouth daily at 12 noon.   valACYclovir 500 MG tablet Commonly known as: VALTREX Take 500 mg by mouth 2 (two) times daily.        Discharge home in stable condition Infant Feeding: Breast Infant Disposition:home with mother Discharge instruction: per After Visit Summary and Postpartum booklet. Activity: Advance as tolerated. Pelvic rest for 6 weeks.  Diet: routine diet Anticipated Birth Control: Unsure Postpartum Appointment:6 weeks Additional Postpartum F/U: n/a Future Appointments:No future appointments. Follow up Visit:      01/11/2020 Tyson Dense, MD

## 2020-01-11 NOTE — Lactation Note (Signed)
This note was copied from a baby's chart. Lactation Consultation Note  Patient Name: Robin Watson [id number] Date: 01/11/2020   Infant is 30 hrs old & has had 3 voids & 7 stools since birth! Mom is eating lunch; infant is sleeping in MGM's arms. Mom & baby will be going home soon.   Mom has no questions & says that breastfeeding is going well. She did say her R nipple is a little sore, but that she is using nipple cream and/or will use colostrum for it. Mom has not been able to view her nipple when infant releases latch b/c infant then places her cheek on Mom's breast.   Mom knows how to reach [place] for any post-discharge questions.    Lurline Hare [facility] 01/11/2020, 1:04 PM

## 2020-01-19 ENCOUNTER — Inpatient Hospital Stay (HOSPITAL_COMMUNITY): Admission: RE | Admit: 2020-01-19 | Payer: 59 | Source: Home / Self Care | Admitting: Obstetrics & Gynecology

## 2020-01-19 ENCOUNTER — Inpatient Hospital Stay (HOSPITAL_COMMUNITY): Payer: 59
